# Patient Record
Sex: Male | Born: 1990 | Race: Black or African American | Hispanic: No | Marital: Single | Smoking: Former smoker
Health system: Southern US, Community
[De-identification: ages and names within clinical notes are randomized; demographics above are authoritative.]

---
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
## 2012-11-29 ENCOUNTER — Emergency Department (HOSPITAL_COMMUNITY)
Admission: EM | Admit: 2012-11-29 | Discharge: 2012-11-29 | Disposition: A | Discharge status: Home / Self Care | Payer: BC Managed Care – PPO | Attending: Emergency Medicine | Admitting: Emergency Medicine

## 2012-11-29 ENCOUNTER — Encounter (HOSPITAL_COMMUNITY): Payer: Self-pay | Admitting: Emergency Medicine

## 2012-11-29 ENCOUNTER — Emergency Department (HOSPITAL_COMMUNITY): Payer: BC Managed Care – PPO

## 2012-11-29 DIAGNOSIS — IMO0002 Reserved for concepts with insufficient information to code with codable children: Secondary | ICD-10-CM | POA: Insufficient documentation

## 2012-11-29 DIAGNOSIS — Y9289 Other specified places as the place of occurrence of the external cause: Secondary | ICD-10-CM | POA: Insufficient documentation

## 2012-11-29 DIAGNOSIS — Y9339 Activity, other involving climbing, rappelling and jumping off: Secondary | ICD-10-CM | POA: Insufficient documentation

## 2012-11-29 DIAGNOSIS — T07XXXA Unspecified multiple injuries, initial encounter: Secondary | ICD-10-CM

## 2012-11-29 DIAGNOSIS — S9305XA Dislocation of left ankle joint, initial encounter: Secondary | ICD-10-CM

## 2012-11-29 DIAGNOSIS — S9306XA Dislocation of unspecified ankle joint, initial encounter: Secondary | ICD-10-CM | POA: Insufficient documentation

## 2012-11-29 DIAGNOSIS — X500XXA Overexertion from strenuous movement or load, initial encounter: Secondary | ICD-10-CM | POA: Insufficient documentation

## 2012-11-29 MED ORDER — OXYCODONE-ACETAMINOPHEN 5-325 MG PO TABS: ORAL_TABLET | ORAL | Status: DC

## 2012-11-29 MED ORDER — ETOMIDATE 2 MG/ML IV SOLN
10.0000 mg | Freq: Once | INTRAVENOUS | Status: AC
  Administered 2012-11-29: 8 mg via INTRAVENOUS
  Filled 2012-11-29: qty 10

## 2012-11-29 MED ORDER — MIDAZOLAM HCL 2 MG/2ML IJ SOLN
2.0000 mg | Freq: Once | INTRAMUSCULAR | Status: AC
  Administered 2012-11-29: 2 mg via INTRAVENOUS
  Filled 2012-11-29: qty 2

## 2012-11-29 MED ORDER — ONDANSETRON 8 MG PO TBDP
8.0000 mg | ORAL_TABLET | Freq: Once | ORAL | Status: AC
  Administered 2012-11-29: 8 mg via ORAL
  Filled 2012-11-29: qty 1

## 2012-11-29 MED ORDER — FENTANYL CITRATE 0.05 MG/ML IJ SOLN
50.0000 ug | Freq: Once | INTRAMUSCULAR | Status: AC
  Administered 2012-11-29: 50 ug via INTRAVENOUS
  Filled 2012-11-29: qty 2

## 2012-11-29 MED ORDER — OXYCODONE-ACETAMINOPHEN 5-325 MG PO TABS
2.0000 | ORAL_TABLET | Freq: Once | ORAL | Status: AC
  Administered 2012-11-29: 2 via ORAL
  Filled 2012-11-29: qty 2

## 2012-11-29 NOTE — Discharge Instructions (Signed)
Ankle Dislocation  Ankle dislocation is the displacement of the bones that form your ankle joint. The ankle joint is designed for a balance of stability and flexibility. The bones of the ankle are held in place by very strong, fibrous tissues (ligaments) that connect the bones to each other.  CAUSES  Because the ankle is a very strong and stable joint, ankle dislocation is only caused by a very forceful injury. Typically, injuries that contribute to ankle dislocation include broken bones (fractures) on the inside and outside of the ankle (malleoli).   RELATED COMPLICATIONS  Ankle dislocation can lead to more serious complications. Examples of complications associated with ankle dislocation include:  · Injury to the strong fibrous tissues that connect muscles to bones (tendons).  · Injury to the flexible tissue that cushions the bones in the joint (cartilage). This can lead to the development of arthritis, loss of joint motion, and pain.  · Injury to the nerves and blood vessels that cross the ankle. Blood vessel damage may result in bone death of the top bone of the foot (talus).  · Skin over the dislocated area being torn (lacerated) or damaged by pressure from the dislocated bones.  · Swelling of compartments in the foot (rare). This may damage blood supply to the muscles (compartment syndrome).  RISK FACTORS  Although dislocation of the ankle can occur in anyone, some people are at greater risk than others. People at increased risk of ankle dislocation include:  · Young males. This may be related to their overall increased risk of injury.  · Postmenopausal women. This may be related to their increased risk of bone fracture because of the weakening of the bones that occurs in women in this age group (osteoporosis).  · People born with greater looseness (elasticity) in their ligaments.  SYMPTOMS  Symptoms of ankle dislocation include:  · Severe pain.  · Swelling.  · Deformity around the ankle.  · Whitening or  laceration of the skin.  DIAGNOSIS   A physical exam and an X-ray exam are usually done to help your caregiver diagnose ankle dislocation.  TREATMENT  Treatment may include:  · Manipulation of the ankle by your caregiver to put your ankle back in place (reduction).  · Repair of any associated skin lacerations.  · Plates and screws used to stabilize the fractures and hold the joint in position after reduction.  · Pins drilled into your bones that are connected to bars outside of your skin (external fixator) used to hold your ankle in a fixed position until the swelling in your ankle goes down enough for surgery to be done.  · Placement of a cast or splint to allow torn ligaments to heal.  · Physical therapy to regain ankle motion and leg strength.  HOME CARE INSTRUCTIONS  The following measures can help to reduce pain and hasten the healing process:  · Rest your injured joint. Do not move it. Avoid activities similar to the one that caused your injury.  · Apply ice to your injured joint for 1 to 2 days after your reduction or as directed by your caregiver. Applying ice helps to reduce inflammation and pain.  · Put ice in a plastic bag.  · Place a towel between your skin and the bag.  · Leave the ice on for 15 to 20 minutes at a time, every couple of hours while you are awake.  · Elevate your ankle above your heart to minimize swelling.  · Move your toes   as instructed by your caregiver to prevent stiffness.  · Take over-the-counter or prescription medicines for pain as directed by your caregiver.  SEEK IMMEDIATE MEDICAL CARE IF:  · Your cast, splint, screws, plates, or external fixator becomes loose or damaged.  · You have an external fixator and you notice fluids draining around the pins.  · Your pain becomes worse rather than better.  · You lose feeling in your toe or cannot bend the tip of your toe.  MAKE SURE YOU:  · Understand these instructions.  · Will watch your condition.  · Will get help right away if you  are not doing well or get worse.  Document Released: 08/15/2005 Document Revised: 11/07/2011 Document Reviewed: 01/13/2011  ExitCare® Patient Information ©2013 [employer].

## 2012-11-29 NOTE — ED Provider Notes (Signed)
MSE was initiated and I personally evaluated the patient and placed orders (if any) at  3:37 PM on November 29, 2012.  The patient appears stable so that the remainder of the MSE may be completed by another provider.  22 yo w obvious ankle deformity. Neurovascularly intact. Pain medications and Imaging ordered. Advised nursing to place in main ED as this is not a Fast Track patient.   Mora Bellman, PA-C 11/29/12 1538

## 2012-11-29 NOTE — ED Notes (Signed)
Patient jumping on trampoline and went down wrong on his left ankle.  Extreme deformity present. Abrasions present.  Patient rates pain as an 8.

## 2012-11-29 NOTE — ED Provider Notes (Signed)
History     CSN: [id number]  Arrival date & time 11/29/12  1459   First MD Initiated Contact with Patient 11/29/12 1528      Chief Complaint  Patient presents with  . Ankle Pain    (Consider location/radiation/quality/duration/timing/severity/associated sxs/prior treatment) HPI Comments: Level 5 caveat due to acuity of condition.  Pt was jumping on a trampoline and around 1:30 PM landed awkwardly on left ankle and presents with obv deformity.  Denies any other injury.  Came to waiting room where plain films and norco was given.  Pt has not eaten since breakfast this AM.    Patient is a 22 y.o. male presenting with ankle pain. The history is provided by the patient and a relative.  Ankle Pain   History reviewed. No pertinent past medical history.  History reviewed. No pertinent past surgical history.  History reviewed. No pertinent family history.  History  Substance Use Topics  . Smoking status: Not on file  . Smokeless tobacco: Not on file  . Alcohol Use: Not on file      Review of Systems  Unable to perform ROS: Acuity of condition  Musculoskeletal: Positive for arthralgias.    Allergies  Review of patient's allergies indicates no known allergies.  Home Medications   Current Outpatient Rx  Name  Route  Sig  Dispense  Refill  . ibuprofen (ADVIL,MOTRIN) 200 MG tablet   Oral   Take 400 mg by mouth every 6 (six) hours as needed for pain.         . NON FORMULARY   Oral   Take 1 tablet by mouth daily. hydroxycut         . oxyCODONE-acetaminophen (PERCOCET/ROXICET) 5-325 MG per tablet      1-2 tablets po q 6 hours prn moderate to severe pain   20 tablet   0     BP 132/72  Pulse 76  Temp(Src) 98.3 F (36.8 C) (Oral)  Resp 14  Wt 165 lb (74.844 kg)  SpO2 100%  Physical Exam  Nursing note and vitals reviewed. Constitutional: He appears well-developed.  HENT:  Head: Normocephalic and atraumatic.  Neck: Neck supple.  Cardiovascular: Normal rate.    Pulmonary/Chest: Effort normal. No respiratory distress. He has no wheezes. He has no rales.  Abdominal: Soft. There is no tenderness.  Musculoskeletal:       Left ankle: He exhibits swelling and deformity. He exhibits no laceration. Tenderness. No head of 5th metatarsal and no proximal fibula tenderness found. Achilles tendon exhibits no pain.  Obvious displacement of ankle, dislocation suspected with tenting of lateral skin.  Abrasions present, but do not violate dermis completely.  No lacerations.  Good DP 2+, can wiggle toes, gross sensation on both sides of foot are intact  Neurological: He is alert.  Skin: Skin is warm. No rash noted. He is not diaphoretic.    ED Course  ORTHOPEDIC INJURY TREATMENT Date/Time: 11/29/2012 5:47 PM Performed by: Lear Ng. Authorized by: Lear Ng Consent: Verbal consent obtained. written consent obtained. Risks and benefits: risks, benefits and alternatives were discussed Consent given by: patient and parent Patient understanding: patient states understanding of the procedure being performed Patient consent: the patient's understanding of the procedure matches consent given Procedure consent: procedure consent matches procedure scheduled Imaging studies: imaging studies available Required items: required blood products, implants, devices, and special equipment available Patient identity confirmed: arm band Time out: Immediately prior to procedure a "time out" was called to verify the  correct patient, procedure, equipment, support staff and site/side marked as required. Injury location: ankle Location details: left ankle Injury type: dislocation Dislocation type: posterior Pre-procedure neurovascular assessment: neurovascularly intact Pre-procedure distal perfusion: normal Pre-procedure neurological function: normal Pre-procedure range of motion: reduced Local anesthesia used: no Patient sedated: yes Sedation type: moderate  (conscious) sedation Sedatives: diazepam, etomidate, see MAR for details and fentanyl Analgesia: see MAR for details Sedation start date/time: 11/29/2012 5:40 PM Sedation end date/time: 11/29/2012 5:49 PM Vitals: Vital signs were monitored during sedation. Manipulation performed: yes Reduction method: direct traction Reduction successful: yes X-ray confirmed reduction: yes Immobilization: splint and crutches Splint type: short leg Post-procedure neurovascular assessment: post-procedure neurovascularly intact Post-procedure distal perfusion: normal Post-procedure neurological function: normal Post-procedure range of motion: normal Patient tolerance: Patient tolerated the procedure well with no immediate complications.   (including critical care time)     Labs Reviewed - No data to display Dg Ankle Complete Left  11/29/2012  *RADIOLOGY REPORT*  Clinical Data: Ankle pain after fall  LEFT ANKLE COMPLETE - 3+ VIEW  Comparison: None.  Findings: There appears to be complete posterior dislocation of the talus and left foot relative to the distal tibia with inversion of the foot.  IMPRESSION: Posterior dislocation of the talus and left foot relative to the tibia.   Original Report Authenticated By: Lupita Raider.,  M.D.    Dg Ankle Left Port  11/29/2012  *RADIOLOGY REPORT*  Clinical Data: Ankle dislocation.  Status post reduction.  PORTABLE LEFT ANKLE - 2 VIEW  Comparison: Prior today  Findings: There has been successful reduction of previously seen ankle dislocation.  A splint has been applied which obscures fine bone detail, however no acute fracture identified on this study.  IMPRESSION: Successful reduction of previously seen ankle dislocation.   Original Report Authenticated By: Myles Rosenthal, M.D.      1. Ankle dislocation, left, initial encounter   2. Multiple abrasions     RA sat is 100% which I interpret to be normal   5:51 PM Sedation along with reduction performed.  Post reduction  films pending.     7:06 PM Pt is fully awake, post reduction films reviewed.  Spoke to Dr. Rennis Chris who can see pt in office middle of next week  MDM  Pt with obvious dislocation of elft ankle.  I reviewed plain film images.  Pt received oral pain pills, but no food since this AM.  Will perform procedural sedation and reduce dislocation, splint and refer to ortho.  Good distal perfusion prior to reduction.  Discussed risks and benefits with pt and family who are present, everyone is in agreement to proceed.          Gavin Pound. Oletta Lamas, MD 11/29/12 [id number]

## 2013-08-27 ENCOUNTER — Other Ambulatory Visit (INDEPENDENT_AMBULATORY_CARE_PROVIDER_SITE_OTHER): Payer: Self-pay | Admitting: Otolaryngology

## 2013-08-27 DIAGNOSIS — H905 Unspecified sensorineural hearing loss: Secondary | ICD-10-CM

## 2013-08-30 ENCOUNTER — Ambulatory Visit
Admission: RE | Admit: 2013-08-30 | Discharge: 2013-08-30 | Disposition: A | Discharge status: Home / Self Care | Payer: BC Managed Care – PPO | Source: Ambulatory Visit | Attending: Otolaryngology | Admitting: Otolaryngology

## 2013-08-30 DIAGNOSIS — H905 Unspecified sensorineural hearing loss: Secondary | ICD-10-CM

## 2013-08-30 MED ORDER — GADOBENATE DIMEGLUMINE 529 MG/ML IV SOLN
15.0000 mL | Freq: Once | INTRAVENOUS | Status: AC | PRN
  Administered 2013-08-30: 15 mL via INTRAVENOUS

## 2013-11-09 ENCOUNTER — Encounter (HOSPITAL_COMMUNITY): Payer: Self-pay | Admitting: Emergency Medicine

## 2013-11-09 ENCOUNTER — Emergency Department (HOSPITAL_COMMUNITY)
Admission: EM | Admit: 2013-11-09 | Discharge: 2013-11-09 | Disposition: A | Discharge status: Home / Self Care | Payer: BC Managed Care – PPO | Attending: Emergency Medicine | Admitting: Emergency Medicine

## 2013-11-09 DIAGNOSIS — X58XXXA Exposure to other specified factors, initial encounter: Secondary | ICD-10-CM | POA: Insufficient documentation

## 2013-11-09 DIAGNOSIS — Y929 Unspecified place or not applicable: Secondary | ICD-10-CM | POA: Insufficient documentation

## 2013-11-09 DIAGNOSIS — H1132 Conjunctival hemorrhage, left eye: Secondary | ICD-10-CM

## 2013-11-09 DIAGNOSIS — Y939 Activity, unspecified: Secondary | ICD-10-CM | POA: Insufficient documentation

## 2013-11-09 DIAGNOSIS — H1130 Conjunctival hemorrhage, unspecified eye: Secondary | ICD-10-CM | POA: Insufficient documentation

## 2013-11-09 DIAGNOSIS — Z79899 Other long term (current) drug therapy: Secondary | ICD-10-CM | POA: Insufficient documentation

## 2013-11-09 DIAGNOSIS — S058X9A Other injuries of unspecified eye and orbit, initial encounter: Secondary | ICD-10-CM | POA: Insufficient documentation

## 2013-11-09 DIAGNOSIS — Z87891 Personal history of nicotine dependence: Secondary | ICD-10-CM | POA: Insufficient documentation

## 2013-11-09 MED ORDER — FLUORESCEIN SODIUM 1 MG OP STRP
1.0000 | ORAL_STRIP | Freq: Once | OPHTHALMIC | Status: DC
  Filled 2013-11-09: qty 1

## 2013-11-09 MED ORDER — TETRACAINE HCL 0.5 % OP SOLN
2.0000 [drp] | Freq: Once | OPHTHALMIC | Status: DC
  Filled 2013-11-09: qty 2

## 2013-11-09 NOTE — ED Provider Notes (Signed)
Medical screening examination/treatment/procedure(s) were performed by non-physician practitioner and as supervising physician I was immediately available for consultation/collaboration.   EKG Interpretation None        Richardean Canalavid H Yao, MD 11/09/13 2321

## 2013-11-09 NOTE — ED Notes (Signed)
Pt c/o increasing L eye redness and drainage x 2 days.

## 2013-11-09 NOTE — Discharge Instructions (Signed)
Subconjunctival Hemorrhage °A subconjunctival hemorrhage is a bright red patch covering a portion of the white of the eye. The white part of the eye is called the sclera, and it is covered by a thin membrane called the conjunctiva. This membrane is clear, except for tiny blood vessels that you can see with the naked eye. When your eye is irritated or inflamed and becomes red, it is because the vessels in the conjunctiva are swollen. °Sometimes, a blood vessel in the conjunctiva can break and bleed. When this occurs, the blood builds up between the conjunctiva and the sclera, and spreads out to create a red area. The red spot may be very small at first. It may then spread to cover a larger part of the surface of the eye, or even all of the visible white part of the eye. °In almost all cases, the blood will go away and the eye will become white again. Before completely dissolving, however, the red area may spread. It may also become brownish-yellow in color, before going away. If a lot of blood collects under the conjunctiva, it may look like a bulge on the surface of the eye. This looks scary, but it will also eventually flatten out and go away. Subconjunctival hemorrhages do not cause pain, but if swollen, may cause a feeling of irritation. There is no effect on vision.  °CAUSES  °· The most common cause is mild trauma (rubbing the eye, irritation). °· Subconjunctival hemorrhages can happen because of coughing or straining (lifting heavy objects), vomiting, or sneezing. °· In some cases, your doctor may want to check your blood pressure. High blood pressure can also cause a sunconjunctival hemorrhage. °· Severe trauma or blunt injuries. °· Diseases that affect blood clotting (hemophilia, leukemia). °· Abnormalities of blood vessels behind the eye (carotid cavernous sinus fistula). °· Tumors behind the eye. °· Certain drugs (aspirin, coumadin, heparin). °· Recent eye surgery. °HOME CARE INSTRUCTIONS  °· Do not worry  about the appearance of your eye. You may continue your usual activities. °· Often, follow-up is not necessary. °SEEK MEDICAL CARE IF:  °· Your eye becomes painful. °· The bleeding does not disappear within 3 weeks. °· Bleeding occurs elsewhere, for example, under the skin, in the mouth, or in the other eye. °· You have recurring subconjunctival hemorrhages. °SEEK IMMEDIATE MEDICAL CARE IF:  °· Your vision changes or you have difficulty seeing. °· You develop severe headache, persistent vomiting, confusion, or abnormal drowsiness (lethargy). °· Your eye seems to bulge or protrude from the eye socket. °· You notice the sudden appearance of bruises, or have spontaneous bleeding elsewhere on your body. °Document Released: 08/15/2005 Document Revised: 11/07/2011 Document Reviewed: 07/13/2009 °ExitCare® Patient Information ©2014 [employer]. ° °

## 2013-11-09 NOTE — ED Provider Notes (Signed)
CSN: [id number]     Arrival date & time 11/09/13  1611 History  This chart was scribed for non-physician practitioner, Arthor Captain, PA-C, working with Richardean Canal, MD by Jiles Harold, ED Scribe. This patient was seen in room WTR6/WTR6 and the patient's care was started at 6:07 PM.   Chief Complaint  Patient presents with  . Eye Drainage   The history is provided by the patient. No language interpreter was used.   HPI Comments: Douglas Pacheco is a 23 y.o. male who presents to the Emergency Department complaining of L eye erythema and drainage x 2 days that is progressively worsening. Pt states he went to sleep and states he woke up with redness and pain to the L eye. He reports having an itching sensation initially but denies having itching at this moment. Pt states he had crusting. He denies having pain and discharge. Pt states he began using polymyxin and polytrim and reports having worsening redness. Pt denies photophobia and pain to L eye.  History reviewed. No pertinent past medical history. History reviewed. No pertinent past surgical history. History reviewed. No pertinent family history. History  Substance Use Topics  . Smoking status: Former Games developer  . Smokeless tobacco: Not on file  . Alcohol Use: Yes     Comment: occ    Review of Systems  Constitutional: Negative for fever.  Eyes: Positive for pain, redness and itching. Negative for photophobia, discharge and visual disturbance.   Allergies  Review of patient's allergies indicates no known allergies.  Home Medications   Current Outpatient Rx  Name  Route  Sig  Dispense  Refill  . ibuprofen (ADVIL,MOTRIN) 200 MG tablet   Oral   Take 400 mg by mouth every 6 (six) hours as needed for pain.         . NON FORMULARY   Oral   Take 1 tablet by mouth daily. hydroxycut         . oxyCODONE-acetaminophen (PERCOCET/ROXICET) 5-325 MG per tablet      1-2 tablets po q 6 hours prn moderate to severe pain   20  tablet   0    BP 131/76  Pulse 56  Resp 16  SpO2 99%  Physical Exam  Nursing note and vitals reviewed. Constitutional: He appears well-developed and well-nourished. No distress.  HENT:  Head: Normocephalic and atraumatic.  Eyes: Conjunctivae and EOM are normal. Pupils are equal, round, and reactive to light. Right eye exhibits no discharge. Left eye exhibits no discharge.  Bright red blood under surface of conjunctiva that covers 90% that is visible to part of the L eye. Visual acuity intact. No mattering to lashes.  Neck: Neck supple.  Pulmonary/Chest: Effort normal.  Neurological: He is alert.  Skin: He is not diaphoretic.    ED Course  Procedures  DIAGNOSTIC STUDIES: Oxygen Saturation is 99% on RA, Normal by my interpretation.    COORDINATION OF CARE: 6:14 PM-Discussed treatment plan with pt at bedside and pt agreed to plan.     Labs Review Labs Reviewed - No data to display Imaging Review No results found.   EKG Interpretation None      MDM   Final diagnoses:  Subconjunctival hemorrhage of left eye    Patient  With some subconjunctival hemorrhage. No visual changes.  Please see notes nursing notes for visual acuity exam. Corneal abrasion   Pt is not a contact lens wearer.  Exam non-concerning for orbital cellulitis, hyphema, corneal ulcers.  Patient  understands to follow up with ophthalmology, & to return to ER if new symptoms develop including change in vision, purulent drainage, or entrapment.    I personally performed the services described in this documentation, which was scribed in my presence. The recorded information has been reviewed and is accurate.    Arthor CaptainAbigail Lamanda Rudder, PA-C 11/09/13 1941

## 2015-01-04 IMAGING — CR DG ANKLE PORT 2V*L*
1 series · 2 of 2 positions shown · non-contrast
Comparison: Prior today

CLINICAL DATA: Ankle dislocation.  Status post reduction.

PORTABLE LEFT ANKLE - 2 VIEW

[Series 1: AP · left · 2 of 2 slices shown]
[im 1/2]
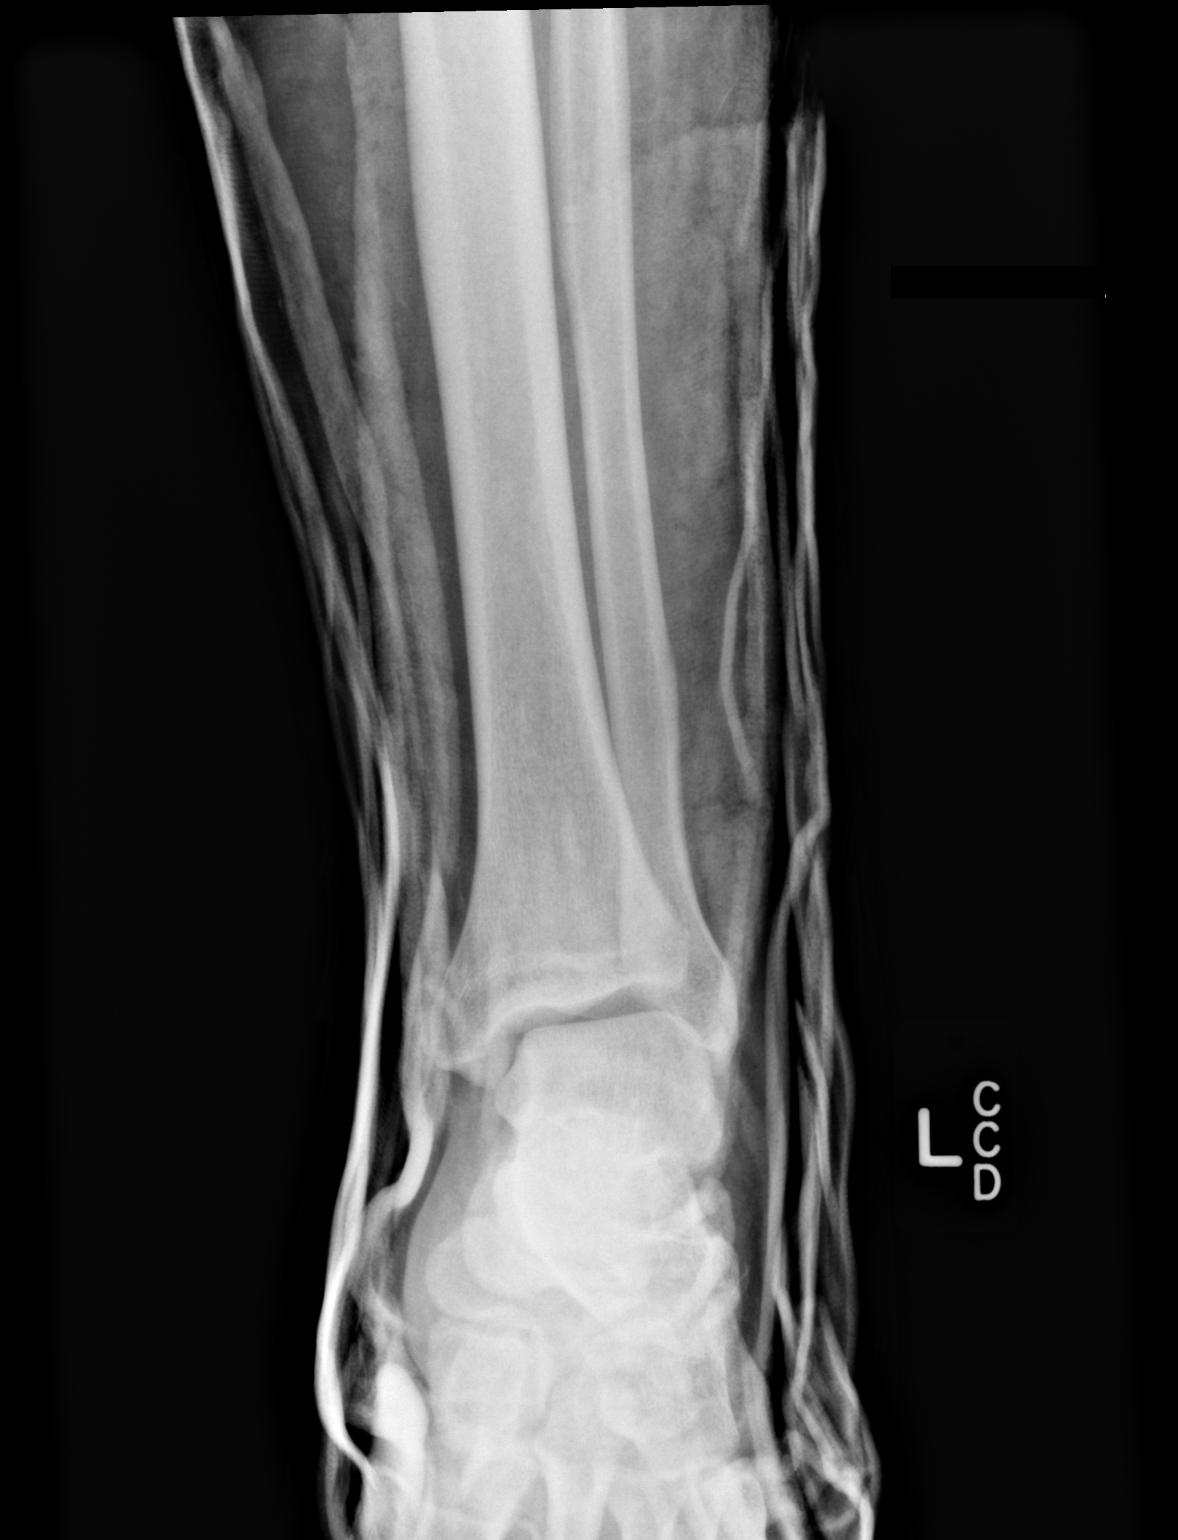
[im 2/2]
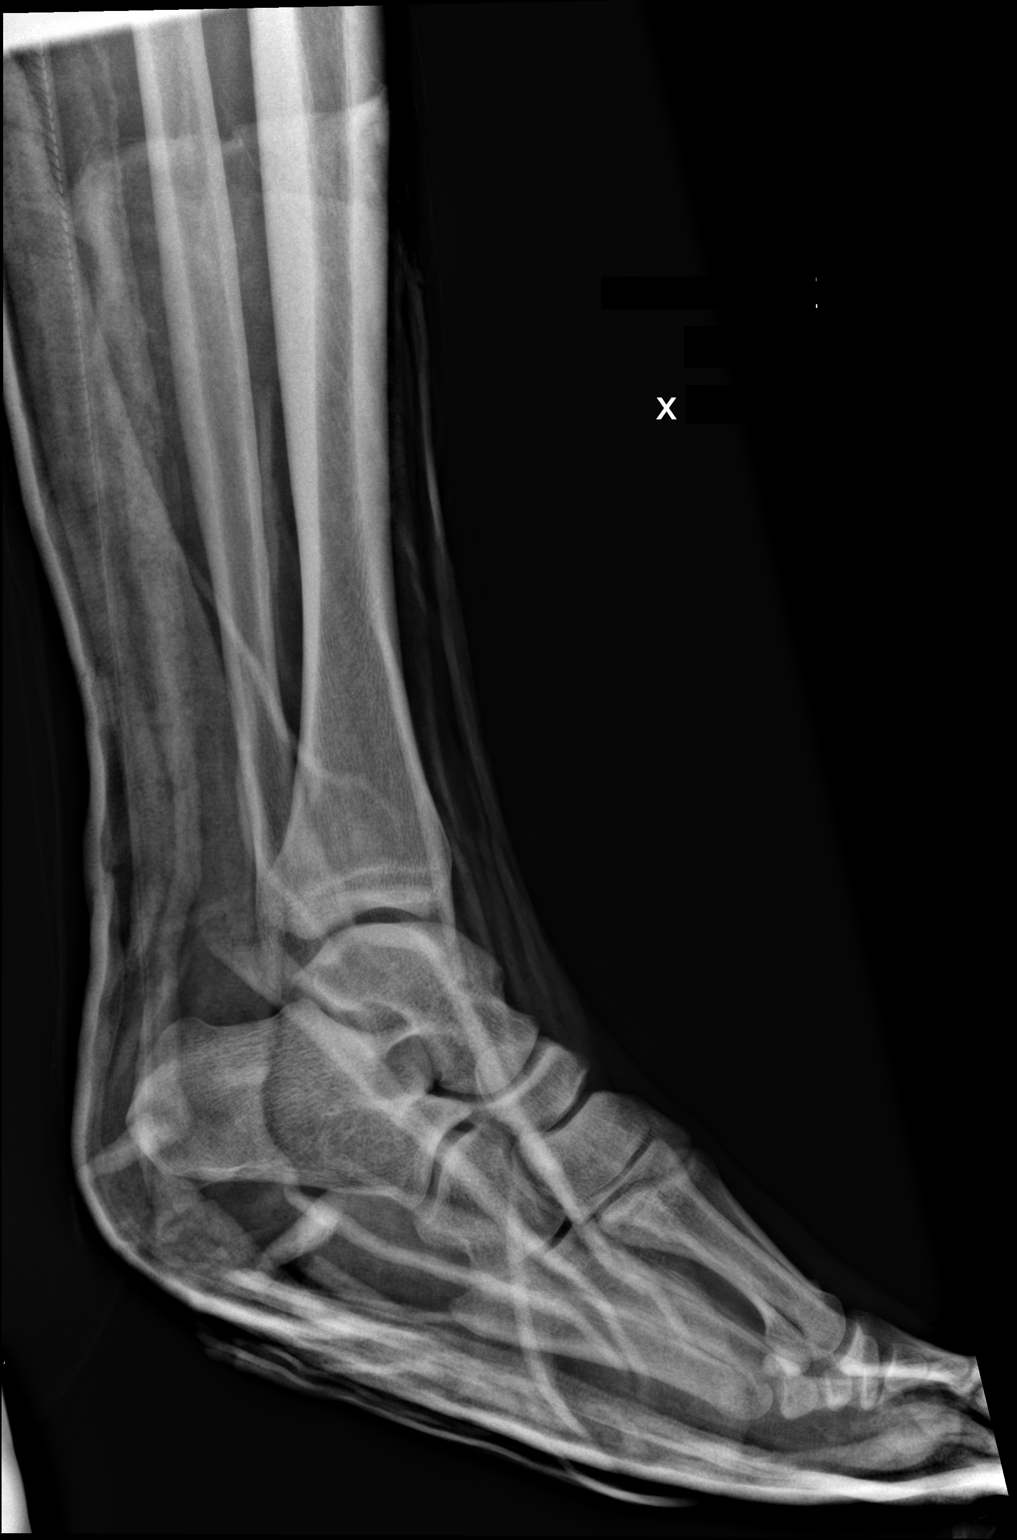

[2 of 2 positions shown; findings below may reference images not displayed]

FINDINGS: There has been successful reduction of previously seen
ankle dislocation.  A splint has been applied which obscures fine
bone detail, however no acute fracture identified on this study.
IMPRESSION: Successful reduction of previously seen ankle dislocation.
